# Patient Record
Sex: Male | Born: 1995 | Race: White | Hispanic: No | Marital: Single | Smoking: Former smoker
Health system: Southern US, Community
[De-identification: ages and names within clinical notes are randomized; demographics above are authoritative.]

## PROBLEM LIST (undated history)

## (undated) DIAGNOSIS — K589 Irritable bowel syndrome without diarrhea: Secondary | ICD-10-CM

## (undated) DIAGNOSIS — F909 Attention-deficit hyperactivity disorder, unspecified type: Secondary | ICD-10-CM

## (undated) DIAGNOSIS — F988 Other specified behavioral and emotional disorders with onset usually occurring in childhood and adolescence: Secondary | ICD-10-CM

## (undated) HISTORY — DX: Irritable bowel syndrome, unspecified: K58.9

## (undated) HISTORY — DX: Attention-deficit hyperactivity disorder, unspecified type: F90.9

## (undated) HISTORY — DX: Other specified behavioral and emotional disorders with onset usually occurring in childhood and adolescence: F98.8

---
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
## 2013-02-19 ENCOUNTER — Emergency Department: Payer: Self-pay | Admitting: Emergency Medicine

## 2013-02-19 LAB — COMPREHENSIVE METABOLIC PANEL
Albumin: 4.3 g/dL (ref 3.8–5.6)
Alkaline Phosphatase: 101 U/L (ref 98–317)
Anion Gap: 3 — ABNORMAL LOW (ref 7–16)
BUN: 14 mg/dL (ref 7–18)
Calcium, Total: 9.3 mg/dL (ref 9.0–10.7)
Co2: 34 mmol/L — ABNORMAL HIGH (ref 21–32)
Creatinine: 1.11 mg/dL (ref 0.60–1.30)
Glucose: 88 mg/dL (ref 65–99)
Potassium: 4 mmol/L (ref 3.5–5.1)
SGOT(AST): 32 U/L (ref 10–41)
Total Protein: 7.3 g/dL (ref 6.4–8.6)

## 2013-02-19 LAB — CBC
HGB: 16.1 g/dL (ref 13.0–18.0)
MCHC: 34.5 g/dL (ref 32.0–36.0)
MCV: 88 fL (ref 80–100)
RDW: 12.8 % (ref 11.5–14.5)
WBC: 10.8 10*3/uL — ABNORMAL HIGH (ref 3.8–10.6)

## 2013-02-20 LAB — URINALYSIS, COMPLETE
Bacteria: NONE SEEN
Bilirubin,UR: NEGATIVE
Glucose,UR: NEGATIVE mg/dL (ref 0–75)
Ketone: NEGATIVE
Leukocyte Esterase: NEGATIVE
Protein: NEGATIVE
RBC,UR: NONE SEEN /HPF (ref 0–5)
WBC UR: <1 /HPF (ref 0–5)

## 2013-05-02 ENCOUNTER — Emergency Department: Payer: Self-pay | Admitting: Emergency Medicine

## 2013-05-02 LAB — COMPREHENSIVE METABOLIC PANEL
Bilirubin,Total: 0.3 mg/dL (ref 0.2–1.0)
EGFR (African American): >60
EGFR (Non-African Amer.): >60
Osmolality: 276 (ref 275–301)
Potassium: 3.6 mmol/L (ref 3.5–5.1)
SGOT(AST): 38 U/L (ref 10–41)
Sodium: 139 mmol/L (ref 136–145)
Total Protein: 7 g/dL (ref 6.4–8.6)

## 2013-05-02 LAB — CBC
MCH: 30.9 pg (ref 26.0–34.0)
MCHC: 35.6 g/dL (ref 32.0–36.0)
Platelet: 228 10*3/uL (ref 150–440)
RBC: 5.2 10*6/uL (ref 4.40–5.90)
RDW: 12.9 % (ref 11.5–14.5)
WBC: 11.6 10*3/uL — ABNORMAL HIGH (ref 3.8–10.6)

## 2013-05-03 LAB — URINALYSIS, COMPLETE
Bacteria: NONE SEEN
Bilirubin,UR: NEGATIVE
Blood: NEGATIVE
Glucose,UR: NEGATIVE mg/dL (ref 0–75)
Leukocyte Esterase: NEGATIVE
Nitrite: NEGATIVE
RBC,UR: NONE SEEN /HPF (ref 0–5)
Specific Gravity: 1.005 (ref 1.003–1.030)
Squamous Epithelial: NONE SEEN
WBC UR: NONE SEEN /HPF (ref 0–5)

## 2013-05-05 ENCOUNTER — Emergency Department: Payer: Self-pay | Admitting: Emergency Medicine

## 2013-05-06 LAB — CBC
HGB: 17 g/dL (ref 13.0–18.0)
MCH: 30.3 pg (ref 26.0–34.0)
Platelet: 227 10*3/uL (ref 150–440)
RBC: 5.61 10*6/uL (ref 4.40–5.90)
RDW: 13.1 % (ref 11.5–14.5)

## 2013-05-06 LAB — COMPREHENSIVE METABOLIC PANEL
Albumin: 4.5 g/dL (ref 3.8–5.6)
Alkaline Phosphatase: 115 U/L (ref 98–317)
Anion Gap: 2 — ABNORMAL LOW (ref 7–16)
BUN: 11 mg/dL (ref 7–18)
Bilirubin,Total: 0.4 mg/dL (ref 0.2–1.0)
Calcium, Total: 9.3 mg/dL (ref 9.0–10.7)
Chloride: 106 mmol/L (ref 98–107)
Co2: 32 mmol/L (ref 21–32)
Creatinine: 1.02 mg/dL (ref 0.60–1.30)
EGFR (African American): >60
Glucose: 94 mg/dL (ref 65–99)
SGOT(AST): 36 U/L (ref 10–41)
SGPT (ALT): 31 U/L (ref 12–78)
Sodium: 140 mmol/L (ref 136–145)

## 2013-05-06 LAB — URINALYSIS, COMPLETE
Bacteria: NONE SEEN
Blood: NEGATIVE
Ketone: NEGATIVE
Leukocyte Esterase: NEGATIVE
Protein: 30
Specific Gravity: 1.025 (ref 1.003–1.030)
Squamous Epithelial: NONE SEEN

## 2013-10-05 IMAGING — CT CT ABD-PELV W/ CM
1 of 2 series · 15 of 32 positions shown, 19 images · non-contrast
Comparison: none

REASON FOR EXAM: (1) RLQ PAIN X 3-4 DAYS; (2) NAUSEA;    NOTE: Nursing to
Give Oral CT Contrast
COMMENTS:   May transport without cardiac monitor

[Series 2: 3mm soft tissue · axial · 0.61mm/px · z∈[-1022,-610]mm · 15 of 151 slices shown, 19 images]
[im 7/151  soft-tissue]
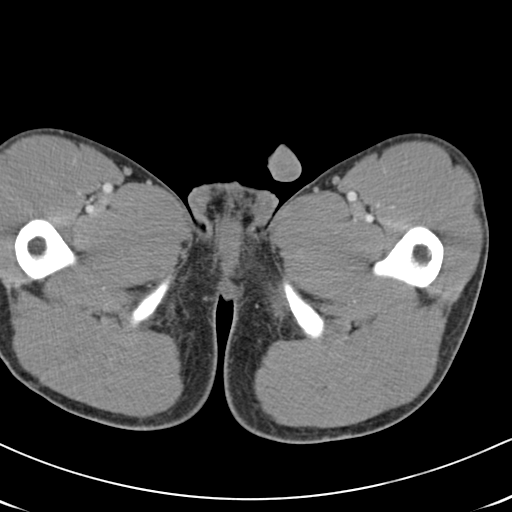
[im 7/151  bone]
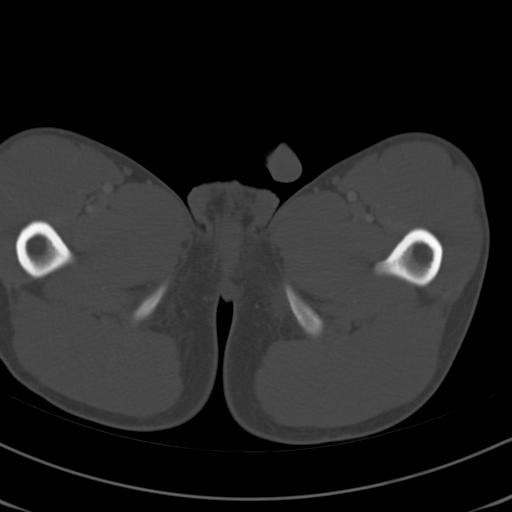
[im 19/151  soft-tissue]
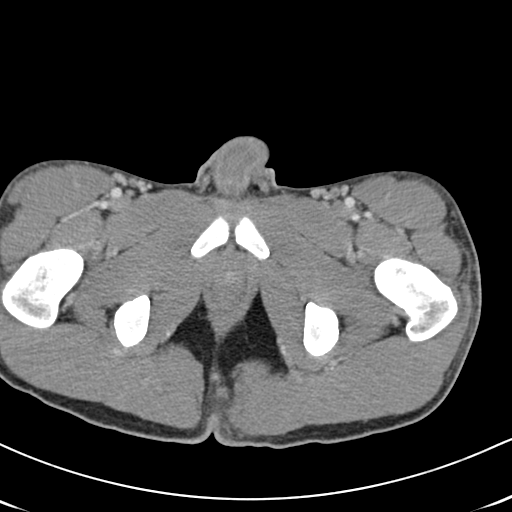
[im 32/151  soft-tissue]
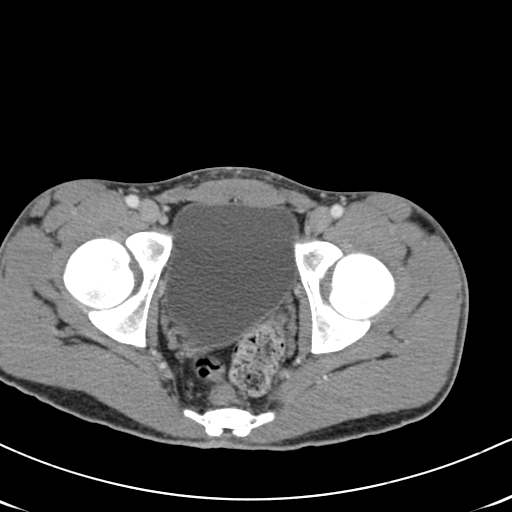
[im 44/151  soft-tissue]
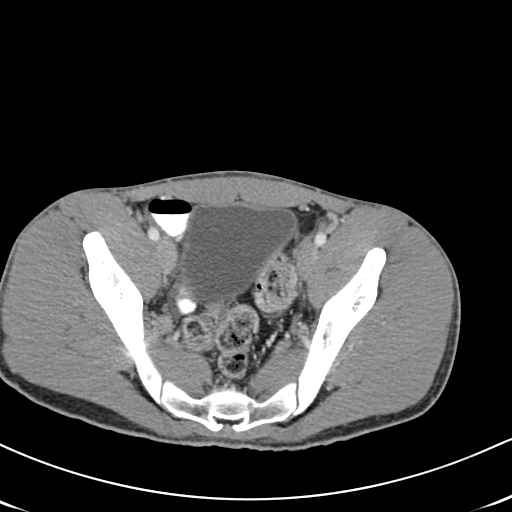
[im 51/151  soft-tissue]
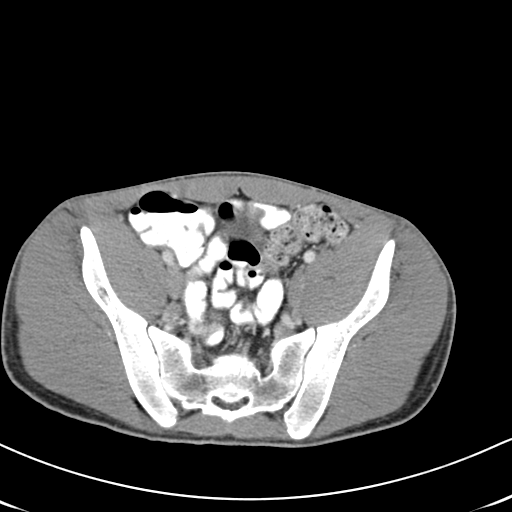
[im 63/151  soft-tissue]
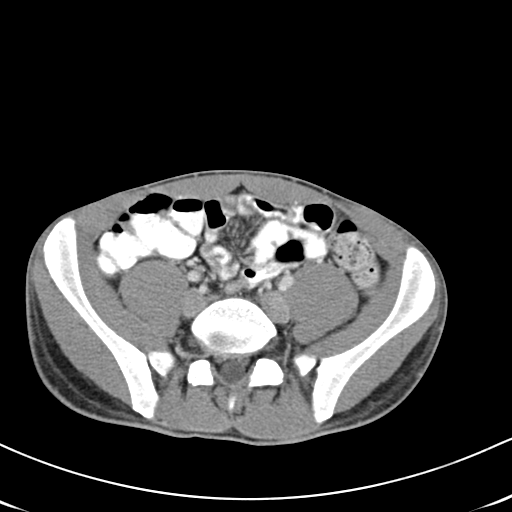
[im 76/151  soft-tissue]
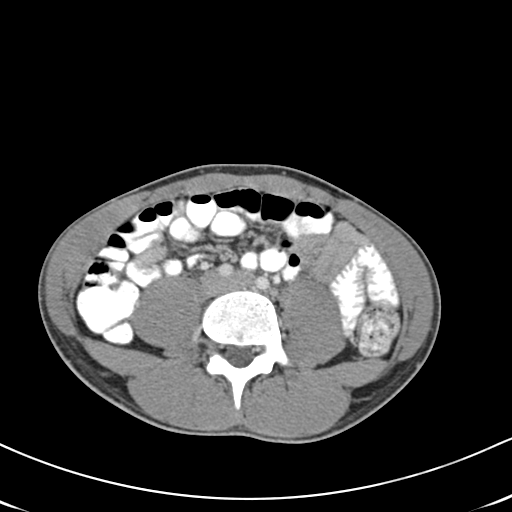
[im 88/151  soft-tissue]
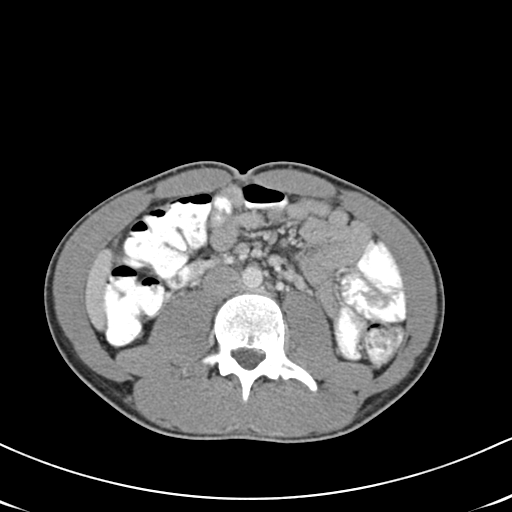
[im 101/151  soft-tissue]
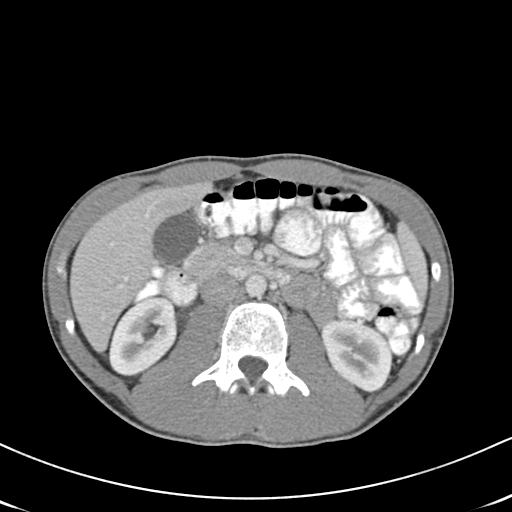
[im 101/151  bone]
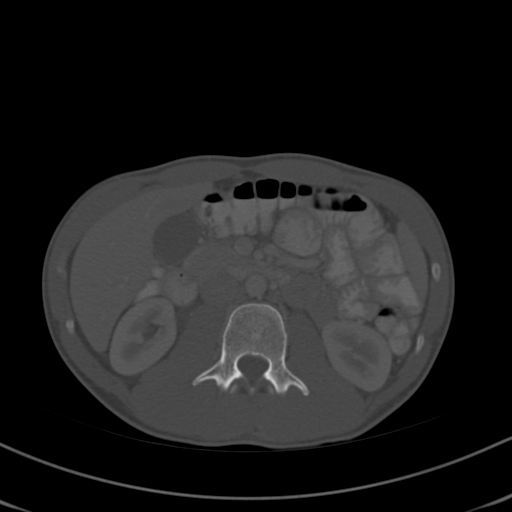
[im 107/151  soft-tissue]
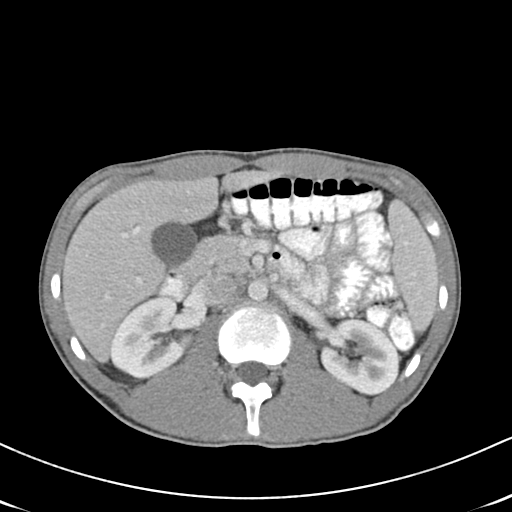
[im 119/151  soft-tissue]
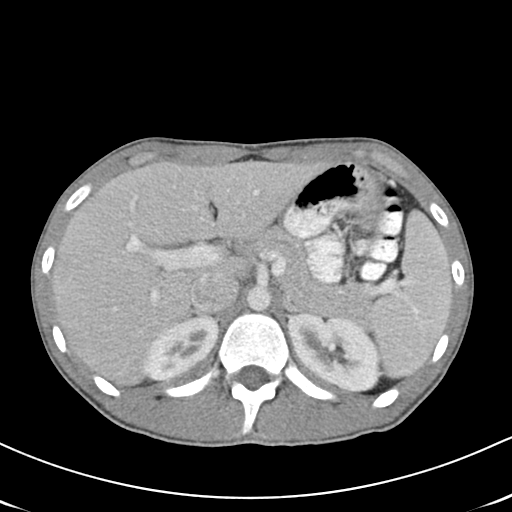
[im 126/151  lung]
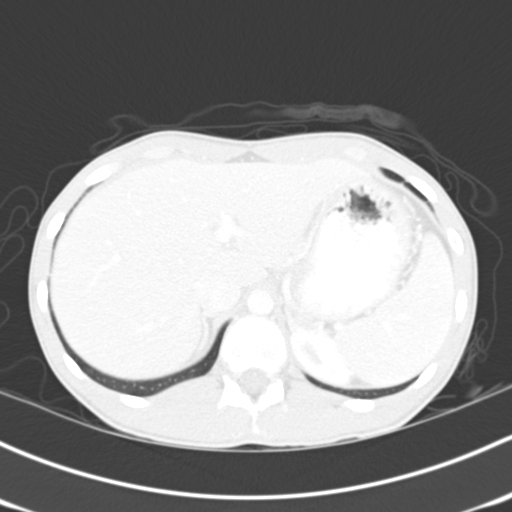
[im 132/151  soft-tissue]
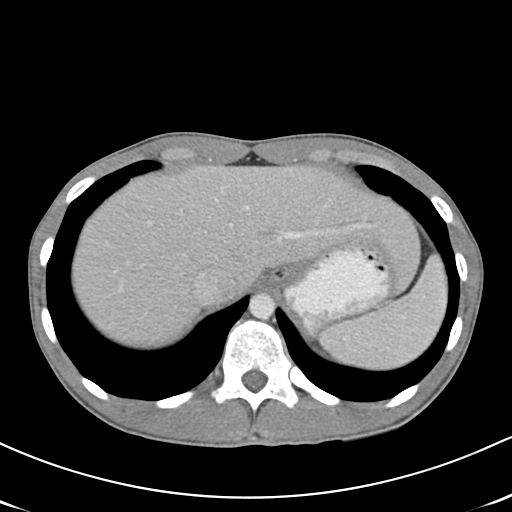
[im 132/151  lung]
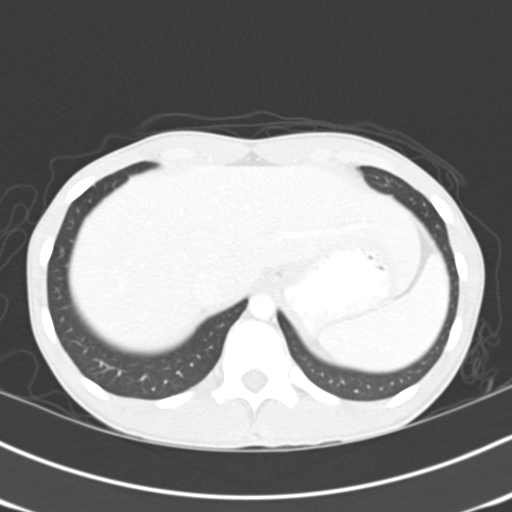
[im 138/151  lung]
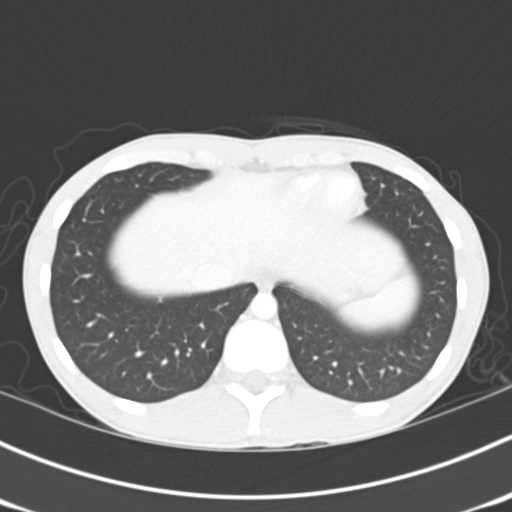
[im 144/151  soft-tissue]
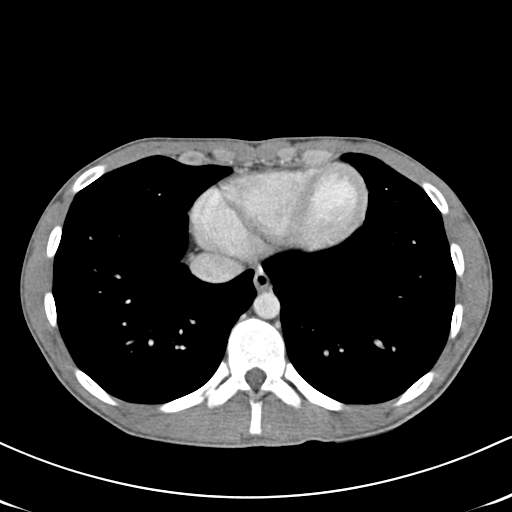
[im 144/151  lung]
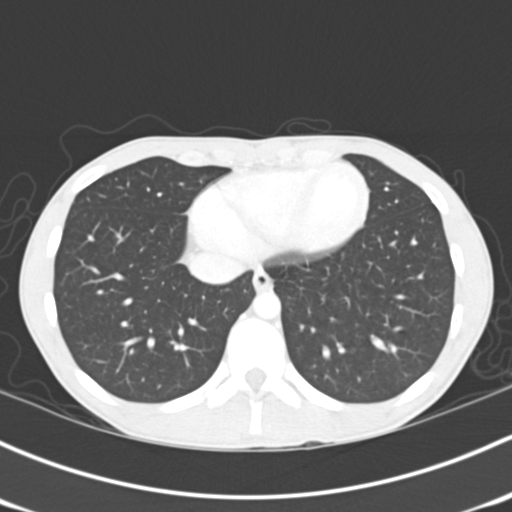

[15 of 32 positions shown; findings below may reference images not displayed]

PROCEDURE:     CT  - CT ABDOMEN / PELVIS  W  - May 03, 2013  [DATE]

RESULT:     CT of the abdomen and pelvis is performed with oral contrast and
100 mL of Dsovue-1XX iodinated contrast. Images are reconstructed in the
axial plane at 3.0 mm slice thickness and in the coronal plane at 3.0 mm
slice thickness. The patient has no previous exam for comparison.

The lung bases are clear. But the study shows no evidence of acute
appendicitis. No bowel wall thickening is evident. There no evidence of
radiopaque gallstones or of nephrolithiasis. The liver, spleen, pancreas,
adrenal glands, abdominal aorta, stomach, small bowel and colon appear to be
unremarkable. No adenopathy is evident. There is no ascites or abnormal
fluid collection.
IMPRESSION: 1. No acute abnormality in the abdomen or pelvis. No findings of acute
appendicitis evident.

[REDACTED]

## 2015-02-20 ENCOUNTER — Ambulatory Visit (INDEPENDENT_AMBULATORY_CARE_PROVIDER_SITE_OTHER): Payer: Managed Care, Other (non HMO) | Admitting: Primary Care

## 2015-02-20 ENCOUNTER — Encounter: Payer: Self-pay | Admitting: Primary Care

## 2015-02-20 VITALS — BP 118/64 | HR 72 | Temp 97.5°F | Ht 66.25 in | Wt 116.8 lb

## 2015-02-20 DIAGNOSIS — F419 Anxiety disorder, unspecified: Principal | ICD-10-CM

## 2015-02-20 DIAGNOSIS — H60393 Other infective otitis externa, bilateral: Secondary | ICD-10-CM | POA: Diagnosis not present

## 2015-02-20 DIAGNOSIS — F418 Other specified anxiety disorders: Secondary | ICD-10-CM | POA: Diagnosis not present

## 2015-02-20 DIAGNOSIS — F32A Depression, unspecified: Secondary | ICD-10-CM | POA: Insufficient documentation

## 2015-02-20 DIAGNOSIS — H65493 Other chronic nonsuppurative otitis media, bilateral: Secondary | ICD-10-CM | POA: Diagnosis not present

## 2015-02-20 DIAGNOSIS — H6693 Otitis media, unspecified, bilateral: Secondary | ICD-10-CM

## 2015-02-20 DIAGNOSIS — F329 Major depressive disorder, single episode, unspecified: Secondary | ICD-10-CM | POA: Insufficient documentation

## 2015-02-20 DIAGNOSIS — H60399 Other infective otitis externa, unspecified ear: Secondary | ICD-10-CM | POA: Insufficient documentation

## 2015-02-20 MED ORDER — PAROXETINE HCL 20 MG PO TABS: 20.0000 mg | ORAL_TABLET | Freq: Every day | ORAL | Status: DC

## 2015-02-20 MED ORDER — AMOXICILLIN-POT CLAVULANATE 875-125 MG PO TABS: 1.0000 | ORAL_TABLET | Freq: Two times a day (BID) | ORAL | Status: DC

## 2015-02-20 NOTE — Progress Notes (Signed)
Pre visit review using our clinic review tool, if applicable. No additional management support is needed unless otherwise documented below in the visit note. 

## 2015-02-20 NOTE — Assessment & Plan Note (Signed)
History of as a child with anger outbursts. PHQ-9 score of 21 today. Denies SI/HI. Declines therapy. Started paxil 20 mg tablets daily. Discussed side effects. Follow up in 4 weeks for re-evaluation.
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
## 2015-02-20 NOTE — Assessment & Plan Note (Signed)
Moderate amount of puss to bilateral ears with erythema to canals. He had no complaints of his ears today, so this was just noted on exam. He reports recurrent ear infections since childhood and now has decreased hearing bilaterally. Urgent referral made to ENT for patient. RX for Augmentin BID x 7 days.

## 2015-02-20 NOTE — Patient Instructions (Signed)
Start Augmentin antibiotics. Take 1 tablet by mouth twice daily for 7 days.  Start Paroxetine tablets for anxiety and depression. Take 1/2 tablet by mouth every morning for 6 days, then start taking 1 full tablet by mouth every morning on day 7.  Stop by the front and schedule your appointment to ENT with [facility].  Follow up in 4 weeks for re-evaluation.

## 2015-03-20 ENCOUNTER — Ambulatory Visit: Payer: Managed Care, Other (non HMO) | Admitting: Primary Care

## 2015-03-20 ENCOUNTER — Telehealth: Payer: Self-pay | Admitting: Primary Care

## 2015-03-20 DIAGNOSIS — Z0289 Encounter for other administrative examinations: Secondary | ICD-10-CM

## 2015-03-20 NOTE — Telephone Encounter (Signed)
Patient did not come in for their appointment today for 30 min 4 week follow up.  Please let me know if patient needs to be contacted immediately for follow up or no follow up needed.

## 2015-03-20 NOTE — Telephone Encounter (Signed)
Yes, please reschedule. Thanks.

## 2015-03-22 NOTE — Telephone Encounter (Signed)
Left message asking pt to return call.  

## 2015-03-27 NOTE — Telephone Encounter (Signed)
Scheduled 7/27

## 2015-03-28 ENCOUNTER — Ambulatory Visit: Payer: Managed Care, Other (non HMO) | Admitting: Primary Care

## 2015-07-29 ENCOUNTER — Encounter: Payer: Self-pay | Admitting: Emergency Medicine

## 2015-07-29 ENCOUNTER — Emergency Department
Admission: EM | Admit: 2015-07-29 | Discharge: 2015-07-29 | Disposition: A | Discharge status: Home / Self Care | Payer: Managed Care, Other (non HMO) | Attending: Emergency Medicine | Admitting: Emergency Medicine

## 2015-07-29 DIAGNOSIS — F909 Attention-deficit hyperactivity disorder, unspecified type: Secondary | ICD-10-CM | POA: Diagnosis not present

## 2015-07-29 DIAGNOSIS — F17210 Nicotine dependence, cigarettes, uncomplicated: Secondary | ICD-10-CM | POA: Diagnosis not present

## 2015-07-29 DIAGNOSIS — L259 Unspecified contact dermatitis, unspecified cause: Secondary | ICD-10-CM | POA: Insufficient documentation

## 2015-07-29 DIAGNOSIS — R609 Edema, unspecified: Secondary | ICD-10-CM | POA: Diagnosis present

## 2015-07-29 DIAGNOSIS — Z792 Long term (current) use of antibiotics: Secondary | ICD-10-CM | POA: Diagnosis not present

## 2015-07-29 DIAGNOSIS — Z79899 Other long term (current) drug therapy: Secondary | ICD-10-CM | POA: Diagnosis not present

## 2015-07-29 MED ORDER — HYDROXYZINE HCL 50 MG PO TABS
50.0000 mg | ORAL_TABLET | Freq: Once | ORAL | Status: AC
  Administered 2015-07-29: 50 mg via ORAL
  Filled 2015-07-29: qty 1

## 2015-07-29 MED ORDER — DEXAMETHASONE SODIUM PHOSPHATE 10 MG/ML IJ SOLN
10.0000 mg | Freq: Once | INTRAMUSCULAR | Status: AC
  Administered 2015-07-29: 10 mg via INTRAMUSCULAR
  Filled 2015-07-29: qty 1

## 2015-07-29 MED ORDER — METHYLPREDNISOLONE 4 MG PO TBPK: ORAL_TABLET | ORAL | Status: DC

## 2015-07-29 MED ORDER — HYDROXYZINE HCL 50 MG PO TABS: 50.0000 mg | ORAL_TABLET | Freq: Three times a day (TID) | ORAL | Status: DC | PRN

## 2015-07-29 NOTE — ED Notes (Signed)
Patient ambulatory to triage with steady gait, without difficulty or distress noted; pt reports since Friday having generalized rash/swelling to face with unknown cause; taking ibuprofen only for symptoms

## 2015-07-29 NOTE — Discharge Instructions (Signed)

## 2015-07-29 NOTE — ED Notes (Signed)
Pt has slight swelling to nose and upper lip. Denies itching, just has pain. Denies any new products in use. States has happened in past as well, but unsure why

## 2015-07-29 NOTE — ED Provider Notes (Signed)
[facility] Emergency Department Provider Note  ____________________________________________  Time seen: Approximately 10:34 PM  I have reviewed the triage vital signs and the nursing notes.   HISTORY  Chief Complaint Allergic Reaction    HPI Brent Shields is a 19 y.o. male facial erythema and mild edema. Patient state this is his third episode having this reaction. Patient states does not know what is causing the reaction. Patient states in the past the swelling and redness resolves in 3-5 days. Patient is concerned because this onset is worse than the previous ones. Patient denies any dysphagia or dyspnea. Patient denies any new foods or personal hygiene products  Past Medical History  Diagnosis Date  . ADD (attention deficit disorder)   . IBS (irritable bowel syndrome)     Patient Active Problem List   Diagnosis Date Noted  . Anxiety and depression 02/20/2015  . Otitis, externa, infective 02/20/2015    History reviewed. No pertinent past surgical history.  Current Outpatient Rx  Name  Route  Sig  Dispense  Refill  . amoxicillin-clavulanate (AUGMENTIN) 875-125 MG per tablet   Oral   Take 1 tablet by mouth 2 (two) times daily.   14 tablet   0   . hydrOXYzine (ATARAX/VISTARIL) 50 MG tablet   Oral   Take 1 tablet (50 mg total) by mouth 3 (three) times daily as needed.   30 tablet   0   . methylPREDNISolone (MEDROL DOSEPAK) 4 MG TBPK tablet      Take Tapered dose as directed   21 tablet   0   . PARoxetine (PAXIL) 20 MG tablet   Oral   Take 1 tablet (20 mg total) by mouth daily.   30 tablet   1     Allergies Review of patient's allergies indicates no known allergies.  No family history on file.  Social History Social History  Substance Use Topics  . Smoking status: Current Every Day Smoker -- 0.50 packs/day    Types: Cigarettes  . Smokeless tobacco: None  . Alcohol Use: No    Review of Systems Constitutional: No  fever/chills Eyes: No visual changes. ENT: No sore throat. Cardiovascular: Denies chest pain. Respiratory: Denies shortness of breath. Gastrointestinal: No abdominal pain.  No nausea, no vomiting.  No diarrhea.  No constipation. Genitourinary: Negative for dysuria. Musculoskeletal: Negative for back pain. Skin: Negative for rash. Neurological: Negative for headaches, focal weakness or numbness. *Psychiatric:ADD 10-point ROS otherwise negative.  ____________________________________________   PHYSICAL EXAM:  VITAL SIGNS: ED Triage Vitals  Enc Vitals Group     BP 07/29/15 2220 146/80 mmHg     Pulse Rate 07/29/15 2220 105     Resp 07/29/15 2220 20     Temp 07/29/15 2220 98 F (36.7 C)     Temp Source 07/29/15 2220 Oral     SpO2 07/29/15 2220 97 %     Weight 07/29/15 2220 112 lb (50.803 kg)     Height 07/29/15 2220 5\' 6"  (1.676 m)     Head Cir --      Peak Flow --      Pain Score --      Pain Loc --      Pain Edu? --      Excl. in GC? --     Constitutional: Alert and oriented. Well appearing and in no acute distress. Eyes: Conjunctivae are normal. PERRL. EOMI. Head: Atraumatic. Nose: No congestion/rhinnorhea. Mouth/Throat: Mucous membranes are moist.  Oropharynx non-erythematous. Neck: No  stridor. No cervical spine tenderness to palpation. Hematological/Lymphatic/Immunilogical: No cervical lymphadenopathy. Cardiovascular: Normal rate, regular rhythm. Grossly normal heart sounds.  Good peripheral circulation. Respiratory: Normal respiratory effort.  No retractions. Lungs CTAB. Gastrointestinal: Soft and nontender. No distention. No abdominal bruits. No CVA tenderness. Musculoskeletal: No lower extremity tenderness nor edema.  No joint effusions. Neurologic:  Normal speech and language. No gross focal neurologic deficits are appreciated. No gait instability. Skin:  Skin is warm, dry and intact. Erythema and edema confined to the nasal in no oral areas. Psychiatric: Mood  and affect are normal. Speech and behavior are normal.  ____________________________________________   LABS (all labs ordered are listed, but only abnormal results are displayed)  Labs Reviewed - No data to display ____________________________________________  EKG   ____________________________________________  RADIOLOGY   ____________________________________________   PROCEDURES  Procedure(s) performed: None  Critical Care performed: No  ____________________________________________   INITIAL IMPRESSION / ASSESSMENT AND PLAN / ED COURSE  Pertinent labs & imaging results that were available during my care of the patient were reviewed by me and considered in my medical decision making (see chart for details).  Contact dermatitis. Patient given Decadron Atarax in the ER. Patient given prescription for prednisone and Atarax upon discharge. Advised patient to try to discover the etiology for his condition. Return back to ER if condition worsens. ____________________________________________   FINAL CLINICAL IMPRESSION(S) / ED DIAGNOSES  Final diagnoses:  Contact dermatitis       Joni Reining, PA-C 07/29/15 2244  Sharman Cheek, MD 07/29/15 2330

## 2015-07-29 NOTE — ED Notes (Signed)
AAOx3.  Skin warm and dry.  NAD 

## 2016-05-23 ENCOUNTER — Ambulatory Visit (INDEPENDENT_AMBULATORY_CARE_PROVIDER_SITE_OTHER): Payer: Managed Care, Other (non HMO) | Admitting: Family Medicine

## 2016-05-23 ENCOUNTER — Encounter: Payer: Self-pay | Admitting: Family Medicine

## 2016-05-23 VITALS — BP 148/80 | HR 84 | Wt 120.0 lb

## 2016-05-23 DIAGNOSIS — H9213 Otorrhea, bilateral: Secondary | ICD-10-CM

## 2016-05-23 DIAGNOSIS — K089 Disorder of teeth and supporting structures, unspecified: Secondary | ICD-10-CM | POA: Diagnosis not present

## 2016-05-23 DIAGNOSIS — R220 Localized swelling, mass and lump, head: Secondary | ICD-10-CM | POA: Diagnosis not present

## 2016-05-23 MED ORDER — AMOXICILLIN-POT CLAVULANATE 250-62.5 MG/5ML PO SUSR: 500.0000 mg | Freq: Two times a day (BID) | ORAL | 0 refills | Status: AC

## 2016-05-23 NOTE — Progress Notes (Signed)
Subjective:    Patient ID: Brent Shields, male    DOB: 1995/10/24, 20 y.o.   MRN: [id number]  HPI This is a 20 yo male, accompanied by his friend, who presents today with 3 days of facial swelling and pain. Has a history of bad teeth and has had similar symptoms several months ago and was seen in ED and treated with antibiotics. No sore throat. No pain in mouth.  He has been trying to save money to go to the dentist.  Has chronic drainage of ears and was referred to ENT last year. Did not keep his appointment, due to a combination of expense and it was rescheduled to a later date. He has been told he needs to have something done with his ears or he is at risk from hearing loss.  Tobacco use 1/2 ppd. 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 Enjoys restaurant work, just not where he is currently working, doesn't like his schedule and not having a day off.   Past Medical History:  Diagnosis Date  . ADD (attention deficit disorder)   . IBS (irritable bowel syndrome)    No past surgical history on file. No family history on file. Social History  Substance Use Topics  . Smoking status: Current Every Day Smoker    Packs/day: 0.50    Types: Cigarettes  . Smokeless tobacco: Not on file  . Alcohol use No      Review of Systems Per HIP    Objective:   Physical Exam  Constitutional: He is oriented to person, place, and time. He appears well-developed and well-nourished.  HENT:  Head: Atraumatic.  Right Ear: There is drainage.  Left Ear: There is drainage.  Copious amount yellow drainage in bilateral ear canals which are erythematous. Tender to exam. Unable to visualize TMs.  Swelling along nasal folds and lower cheeks R>L. No erythema. No swelling of lips or eyelids.  Front teeth, upper and lower, decayed and broken. Gums are without obvious infection, non tender.   Eyes: Conjunctivae are normal.  Cardiovascular: Normal rate.     Pulmonary/Chest: Effort normal.  Musculoskeletal: Normal range of motion.  Lymphadenopathy:    He has no cervical adenopathy.  Neurological: He is alert and oriented to person, place, and time.  Skin: Skin is warm and dry.  Psychiatric: He has a normal mood and affect. His behavior is normal. Judgment and thought content normal.  Vitals reviewed.     BP (!) 148/80   Pulse 84   Wt 120 lb (54.4 kg)   SpO2 97%   BMI 19.37 kg/m  Wt Readings from Last 3 Encounters:  05/23/16 120 lb (54.4 kg)  07/29/15 112 lb (50.8 kg)  02/20/15 116 lb 12.8 oz (53 kg)       Assessment & Plan:  1. Facial swelling - will cover for cellulitis, does not appear to be an allergic reaction/contact dermatitis - amoxicillin-clavulanate (AUGMENTIN) 250-62.5 MG/5ML suspension; Take 10 mLs (500 mg total) by mouth 2 (two) times daily.  Dispense: 150 mL; Refill: 0 - RTC precautions reviewed  2. Poor dentition - Ambulatory referral to Dentistry  3. Ear drainage, bilateral - physical exam findings similar to provider's notes from 02/20/15 - amoxicillin-clavulanate (AUGMENTIN) 250-62.5 MG/5ML suspension; Take 10 mLs (500 mg total) by mouth 2 (two) times daily.  Dispense: 150 mL; Refill: 0 - Ambulatory referral to ENT  - Had discussion with patient  regarding need to be proactive with his chronic health issues- teeth, ears. Encouraged him to consider smoking cessation.   Olean Ree, FNP-BC  [facility], [facility]  05/23/2016 3:19 PM

## 2016-05-23 NOTE — Patient Instructions (Signed)
I have sent a liquid antibiotic to your pharmacy- take 2 teaspoons twice a day for 7 days If your swelling gets worse or if you develop any difficulty swallowing or breathing call 911 Shirlee LimerickMarion will call you next week about seeing a dentist

## 2017-05-10 ENCOUNTER — Emergency Department
Admission: EM | Admit: 2017-05-10 | Discharge: 2017-05-10 | Disposition: A | Discharge status: Home / Self Care | Payer: 59 | Attending: Emergency Medicine | Admitting: Emergency Medicine

## 2017-05-10 DIAGNOSIS — F17210 Nicotine dependence, cigarettes, uncomplicated: Secondary | ICD-10-CM | POA: Diagnosis not present

## 2017-05-10 DIAGNOSIS — H60331 Swimmer's ear, right ear: Secondary | ICD-10-CM | POA: Diagnosis not present

## 2017-05-10 DIAGNOSIS — H9201 Otalgia, right ear: Secondary | ICD-10-CM | POA: Diagnosis present

## 2017-05-10 MED ORDER — NEOMYCIN-POLYMYXIN-HC 3.5-10000-1 OT SOLN: 3.0000 [drp] | Freq: Three times a day (TID) | OTIC | 0 refills | Status: AC

## 2017-05-10 NOTE — ED Triage Notes (Signed)
Pt c/o right ear pain and some loss of hearing since Thursday, states he has a hx of ear infection in the past.

## 2017-05-10 NOTE — ED Provider Notes (Signed)
[facility] Emergency Department Provider Note  ____________________________________________  Time seen: Approximately 3:18 PM  I have reviewed the triage vital signs and the nursing notes.   HISTORY  Chief Complaint Otalgia    HPI Brent LundJohn E Shampine is a 21 y.o. male Who presents to emergency department complaining of right ear pain. Patient reports that symptoms have been ongoing times several days. Patient reports history of previous otitis externa infections to bilateral ears. Patient denies any fevers or chills, nasal congestion, sore throat. he does not use any eardrops in his ear and has not used a Q-tip.   Past Medical History:  Diagnosis Date  . ADD (attention deficit disorder)   . IBS (irritable bowel syndrome)     Patient Active Problem List   Diagnosis Date Noted  . Anxiety and depression 02/20/2015  . Otitis, externa, infective 02/20/2015    History reviewed. No pertinent surgical history.  Prior to Admission medications   Medication Sig Start Date End Date Taking? Authorizing Provider  neomycin-polymyxin-hydrocortisone (CORTISPORIN) OTIC solution Place 3 drops into the right ear 3 (three) times daily. 05/10/17 05/20/17  Cuthriell, Delorise RoyalsJonathan D, PA-C    Allergies Patient has no known allergies.  No family history on file.  Social History Social History  Substance Use Topics  . Smoking status: Current Every Day Smoker    Packs/day: 0.50    Types: Cigarettes  . Smokeless tobacco: Never Used  . Alcohol use No     Review of Systems  Constitutional: No fever/chills Eyes: No visual changes. No discharge ENT: positive for right ear pain Cardiovascular: no chest pain. Respiratory: no cough. No SOB. Gastrointestinal: No abdominal pain.  No nausea, no vomiting.  No diarrhea.  No constipation. Musculoskeletal: Negative for musculoskeletal pain. Skin: Negative for rash, abrasions, lacerations, ecchymosis. Neurological: Negative for  headaches, focal weakness or numbness. 10-point ROS otherwise negative.  ____________________________________________   PHYSICAL EXAM:  VITAL SIGNS: ED Triage Vitals  Enc Vitals Group     BP 05/10/17 1350 122/82     Pulse Rate 05/10/17 1350 73     Resp 05/10/17 1350 18     Temp 05/10/17 1350 97.6 F (36.4 C)     Temp Source 05/10/17 1350 Oral     SpO2 05/10/17 1350 100 %     Weight 05/10/17 1350 120 lb (54.4 kg)     Height 05/10/17 1350 5\' 6"  (1.676 m)     Head Circumference --      Peak Flow --      Pain Score 05/10/17 1359 8     Pain Loc --      Pain Edu? --      Excl. in GC? --      Constitutional: Alert and oriented. Well appearing and in no acute distress. Eyes: Conjunctivae are normal. PERRL. EOMI. Head: Atraumatic. ENT:      Ears: EC and TM on left is unremarkable. EAC on right is grossly edematous and erythematous. Tenderness to palpation over the tragus.TM is visualized with no bulging or air mucoid fluid level.      Nose: No congestion/rhinnorhea.      Mouth/Throat: Mucous membranes are moist.  Neck: No stridor.   Hematological/Lymphatic/Immunilogical: No cervical lymphadenopathy. Cardiovascular: Normal rate, regular rhythm. Normal S1 and S2.  Good peripheral circulation. Respiratory: Normal respiratory effort without tachypnea or retractions. Lungs CTAB. Good air entry to the bases with no decreased or absent breath sounds. Musculoskeletal: Full range of motion to all extremities. No gross deformities  appreciated. Neurologic:  Normal speech and language. No gross focal neurologic deficits are appreciated.  Skin:  Skin is warm, dry and intact. No rash noted. Psychiatric: Mood and affect are normal. Speech and behavior are normal. Patient exhibits appropriate insight and judgement.   ____________________________________________   LABS (all labs ordered are listed, but only abnormal results are displayed)  Labs Reviewed - No data to  display ____________________________________________  EKG   ____________________________________________  RADIOLOGY   No results found.  ____________________________________________    PROCEDURES  Procedure(s) performed:    Procedures    Medications - No data to display   ____________________________________________   INITIAL IMPRESSION / ASSESSMENT AND PLAN / ED COURSE  Pertinent labs & imaging results that were available during my care of the patient were reviewed by me and considered in my medical decision making (see chart for details).  Review of the [place] CSRS was performed in accordance of the [facility] prior to dispensing any controlled drugs.     Patient's diagnosis is consistent with otitis externa. Patient will be discharged home with prescriptions for antibiotic ear drops. Patient is to follow up with primary care as needed or otherwise directed. Patient is given ED precautions to return to the ED for any worsening or new symptoms.     ____________________________________________  FINAL CLINICAL IMPRESSION(S) / ED DIAGNOSES  Final diagnoses:  Acute swimmer's ear of right side      NEW MEDICATIONS STARTED DURING THIS VISIT:  New Prescriptions   NEOMYCIN-POLYMYXIN-HYDROCORTISONE (CORTISPORIN) OTIC SOLUTION    Place 3 drops into the right ear 3 (three) times daily.        This chart was dictated using voice recognition software/Dragon. Despite best efforts to proofread, errors can occur which can change the meaning. Any change was purely unintentional.    Racheal Patches, PA-C 05/10/17 1530    Jeanmarie Plant, MD 05/10/17 2222

## 2017-05-10 NOTE — ED Notes (Signed)
See triage note, pt c/o right ear pain with decreased hearing.

## 2018-05-13 ENCOUNTER — Telehealth: Payer: Self-pay

## 2018-05-13 NOTE — Telephone Encounter (Signed)
Patient advised as below on voicemail-Jendaya Gossett V Azreal Stthomas, RMA

## 2018-05-13 NOTE — Telephone Encounter (Signed)
Copied from CRM [phone]#[id number]. Topic: General - Other >> May 12, 2018 12:17 PM Angela NevinWilliams, Candice N wrote: Reason for CRM: [facility] Please advise.

## 2018-05-13 NOTE — Telephone Encounter (Signed)
Approved. Thanks.

## 2018-07-09 ENCOUNTER — Ambulatory Visit: Payer: 59 | Admitting: Primary Care

## 2018-07-09 DIAGNOSIS — Z0289 Encounter for other administrative examinations: Secondary | ICD-10-CM

## 2018-08-06 ENCOUNTER — Encounter (HOSPITAL_BASED_OUTPATIENT_CLINIC_OR_DEPARTMENT_OTHER): Payer: Self-pay

## 2018-08-06 ENCOUNTER — Emergency Department (HOSPITAL_BASED_OUTPATIENT_CLINIC_OR_DEPARTMENT_OTHER): Payer: 59

## 2018-08-06 ENCOUNTER — Emergency Department (HOSPITAL_BASED_OUTPATIENT_CLINIC_OR_DEPARTMENT_OTHER)
Admission: EM | Admit: 2018-08-06 | Discharge: 2018-08-07 | Disposition: A | Discharge status: Home / Self Care | Payer: 59 | Attending: Emergency Medicine | Admitting: Emergency Medicine

## 2018-08-06 ENCOUNTER — Other Ambulatory Visit: Payer: Self-pay

## 2018-08-06 DIAGNOSIS — J069 Acute upper respiratory infection, unspecified: Secondary | ICD-10-CM | POA: Insufficient documentation

## 2018-08-06 DIAGNOSIS — B9789 Other viral agents as the cause of diseases classified elsewhere: Secondary | ICD-10-CM

## 2018-08-06 DIAGNOSIS — Z87891 Personal history of nicotine dependence: Secondary | ICD-10-CM | POA: Diagnosis not present

## 2018-08-06 DIAGNOSIS — M791 Myalgia, unspecified site: Secondary | ICD-10-CM | POA: Diagnosis present

## 2018-08-06 MED ORDER — IBUPROFEN 600 MG PO TABS: 600.0000 mg | ORAL_TABLET | Freq: Four times a day (QID) | ORAL | 0 refills | Status: DC | PRN

## 2018-08-06 MED ORDER — ACETAMINOPHEN 325 MG PO TABS
650.0000 mg | ORAL_TABLET | Freq: Once | ORAL | Status: AC
  Administered 2018-08-06: 650 mg via ORAL
  Filled 2018-08-06: qty 2

## 2018-08-06 MED ORDER — BENZONATATE 100 MG PO CAPS: 100.0000 mg | ORAL_CAPSULE | Freq: Three times a day (TID) | ORAL | 0 refills | Status: DC

## 2018-08-06 NOTE — ED Provider Notes (Signed)
[facility] EMERGENCY DEPARTMENT Provider Note   CSN: [id number] Arrival date & time: 08/06/18  1945     History   Chief Complaint Chief Complaint  Patient presents with  . Cough    HPI Brent Shields is a 22 y.o. male.  HPI  This is a 22 year old male who presents with myalgias and cough.  Patient reports several days of myalgias, cold sweats, nonproductive cough.  He did not take his temperature at home but temperature here was noted to be 103.2.  Denies runny nose, ear pain, sore throat.  Denies nausea, vomiting, diarrhea, abdominal pain.  He is a non-smoker.  He states "I gave it a few days but I kept sweating in my bed."  He did take Tylenol with some relief of symptoms.  He did not receive a flu shot this year.  Past Medical History:  Diagnosis Date  . ADD (attention deficit disorder)   . IBS (irritable bowel syndrome)     Patient Active Problem List   Diagnosis Date Noted  . Anxiety and depression 02/20/2015  . Otitis, externa, infective 02/20/2015    History reviewed. No pertinent surgical history.      Home Medications    Prior to Admission medications   Medication Sig Start Date End Date Taking? Authorizing Provider  benzonatate (TESSALON) 100 MG capsule Take 1 capsule (100 mg total) by mouth every 8 (eight) hours. 08/06/18   Horton, Mayer Maskerourtney F, MD  ibuprofen (ADVIL,MOTRIN) 600 MG tablet Take 1 tablet (600 mg total) by mouth every 6 (six) hours as needed. 08/06/18   Horton, Mayer Maskerourtney F, MD    Family History No family history on file.  Social History Social History   Tobacco Use  . Smoking status: Former Smoker    Packs/day: 0.50    Types: Cigarettes  . Smokeless tobacco: Never Used  Substance Use Topics  . Alcohol use: No    Alcohol/week: 0.0 standard drinks  . Drug use: Never     Allergies   Patient has no known allergies.   Review of Systems Review of Systems  Constitutional: Positive for chills and fever.  HENT: Negative for  congestion.   Respiratory: Positive for cough. Negative for shortness of breath.   Cardiovascular: Negative for chest pain.  Gastrointestinal: Negative for abdominal pain, diarrhea, nausea and vomiting.  Musculoskeletal: Positive for myalgias.  Neurological: Negative for headaches.  All other systems reviewed and are negative.    Physical Exam Updated Vital Signs BP 128/81 (BP Location: Left Arm)   Pulse 99   Temp (!) 100.8 F (38.2 C) (Oral)   Resp 18   Wt 59.7 kg   SpO2 100%   BMI 21.26 kg/m   Physical Exam  Constitutional: He is oriented to person, place, and time. He appears well-developed and well-nourished. No distress.  HENT:  Head: Normocephalic and atraumatic.  Mouth/Throat: Oropharynx is clear and moist.  Eyes: Pupils are equal, round, and reactive to light.  Neck: Normal range of motion. Neck supple.  Cardiovascular: Normal rate, regular rhythm and normal heart sounds.  No murmur heard. Pulmonary/Chest: Effort normal and breath sounds normal. No respiratory distress. He has no wheezes.  Abdominal: Soft. Bowel sounds are normal. There is no tenderness. There is no rebound.  Musculoskeletal: He exhibits no edema.  Neurological: He is alert and oriented to person, place, and time.  Skin: Skin is warm and dry. No rash noted.  Psychiatric: He has a normal mood and affect.  Nursing note and  vitals reviewed.    ED Treatments / Results  Labs (all labs ordered are listed, but only abnormal results are displayed) Labs Reviewed - No data to display  EKG None  Radiology Dg Chest 2 View  Result Date: 08/06/2018 CLINICAL DATA:  Cough, chest pain, fever EXAM: CHEST - 2 VIEW COMPARISON:  None. FINDINGS: Lungs are clear.  No pleural effusion or pneumothorax. Heart is normal in size. Visualized osseous structures are within normal limits. IMPRESSION: Normal chest radiographs. Electronically Signed   By: Charline Bills M.D.   On: 08/06/2018 21:55     Procedures Procedures (including critical care time)  Medications Ordered in ED Medications  acetaminophen (TYLENOL) tablet 650 mg (650 mg Oral Given 08/06/18 2125)     Initial Impression / Assessment and Plan / ED Course  I have reviewed the triage vital signs and the nursing notes.  Pertinent labs & imaging results that were available during my care of the patient were reviewed by me and considered in my medical decision making (see chart for details).     Patient presents with myalgias, chills, cough.  He is overall nontoxic-appearing.  Initial vital signs notable for temperature of 103.2.  Otherwise he does not appear ill or septic appearing.  Chest x-ray obtained to rule out pneumonia.  Chest x-ray is clear.  Breath sounds are clear as well.  We discussed the possibility this was flu.  He is otherwise healthy.  He deferred Tamiflu.  Recommended supportive measures.  Tylenol or ibuprofen for fevers.  Tessalon Perles for cough.  Patient stated understanding.  At this time do not feel he needs further work-up.  This is likely viral in nature and patient is very well-appearing.  After history, exam, and medical workup I feel the patient has been appropriately medically screened and is safe for discharge home. Pertinent diagnoses were discussed with the patient. Patient was given return precautions.   Final Clinical Impressions(s) / ED Diagnoses   Final diagnoses:  Viral URI with cough    ED Discharge Orders         Ordered    ibuprofen (ADVIL,MOTRIN) 600 MG tablet  Every 6 hours PRN     08/06/18 2348    benzonatate (TESSALON) 100 MG capsule  Every 8 hours     08/06/18 2348           Horton, Mayer Masker, MD 08/07/18 0101

## 2018-08-06 NOTE — Discharge Instructions (Addendum)
YOu were seen today for cough and fever.  This is likely related to the virus.  It could be the flu.  Supportive measures including Tylenol or Motrin for fevers.  You may take Tessalon Perles for cough.  If you develop any new or worsening symptoms you should be reevaluated.

## 2018-08-06 NOTE — ED Triage Notes (Addendum)
C/o flu like sx x 5 days-NAD-steady gait-took motrin 600mg  10min PTA

## 2020-02-26 IMAGING — CR DG CHEST 2V
2 series · 2 of 2 positions shown · non-contrast
Comparison: None.

CLINICAL DATA: Cough, chest pain, fever

EXAM:
CHEST - 2 VIEW

[w chest pa]
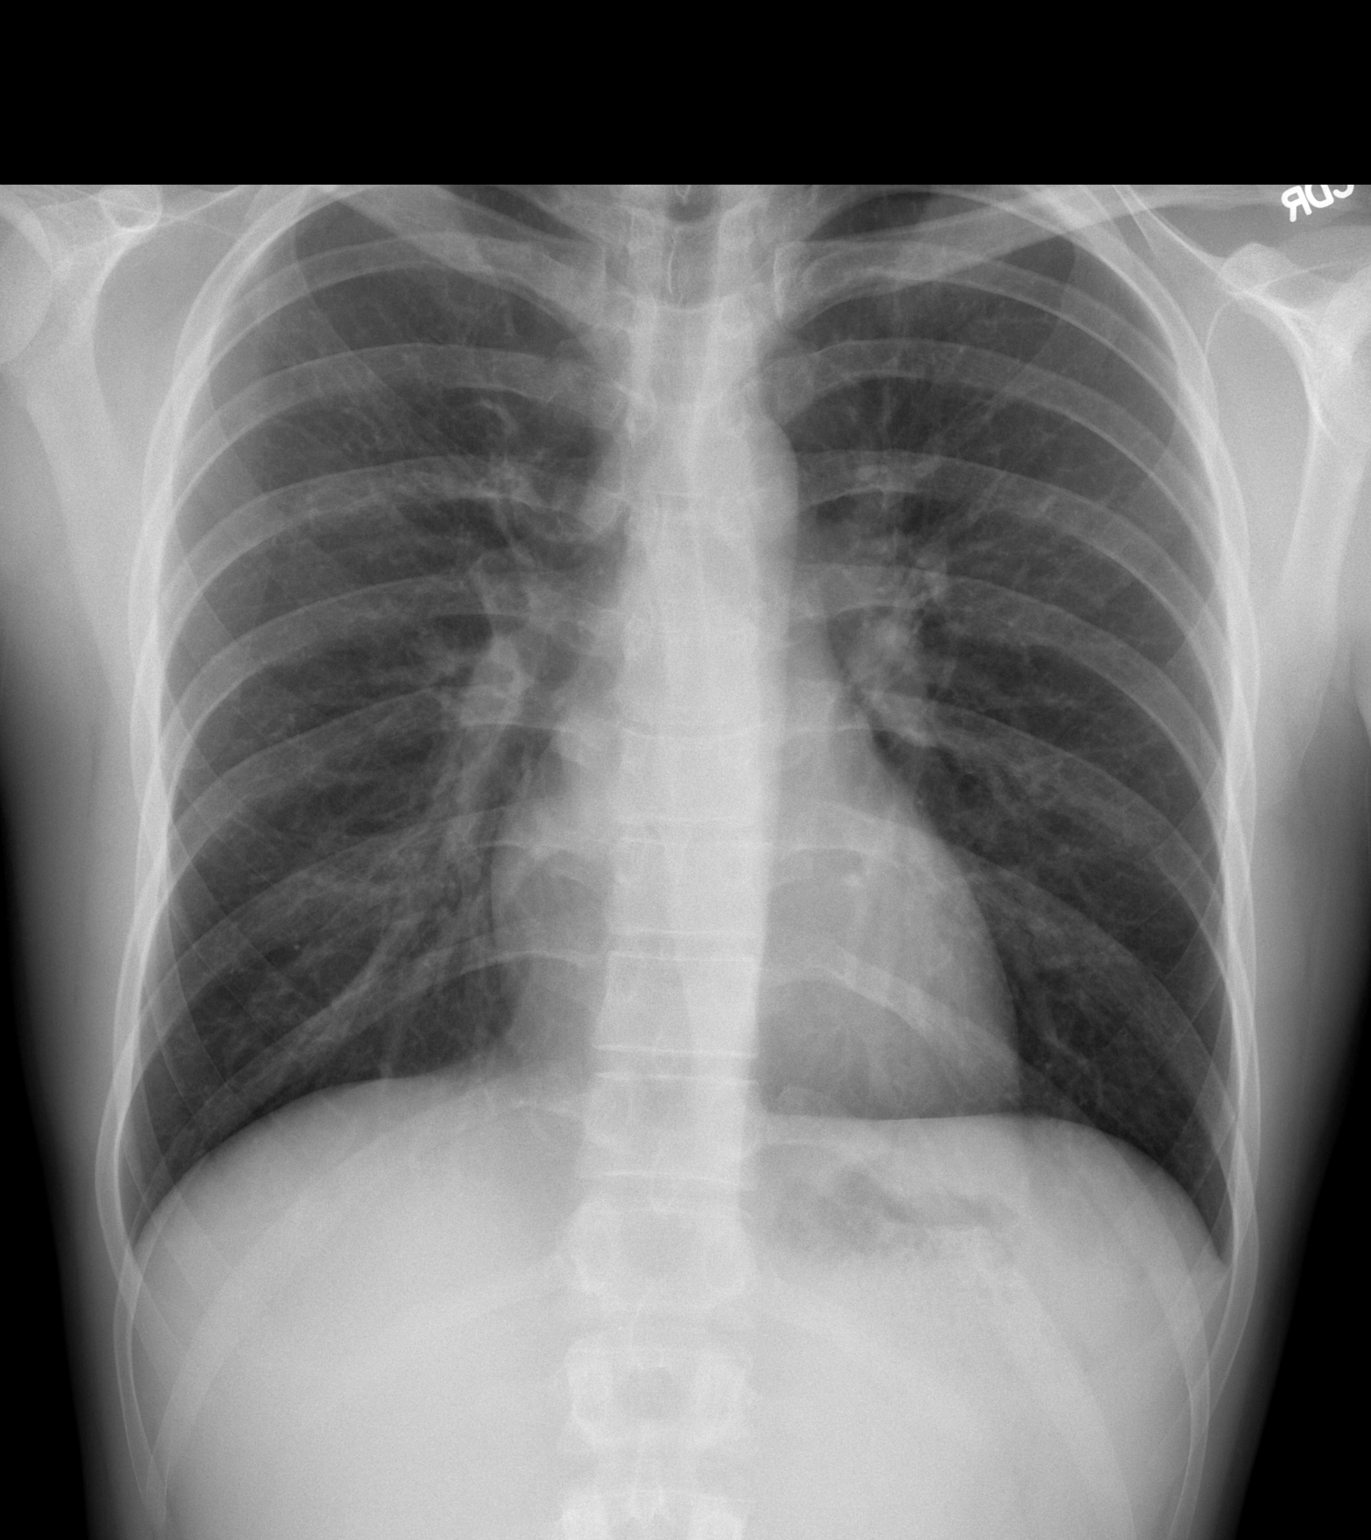

[w chest lat]
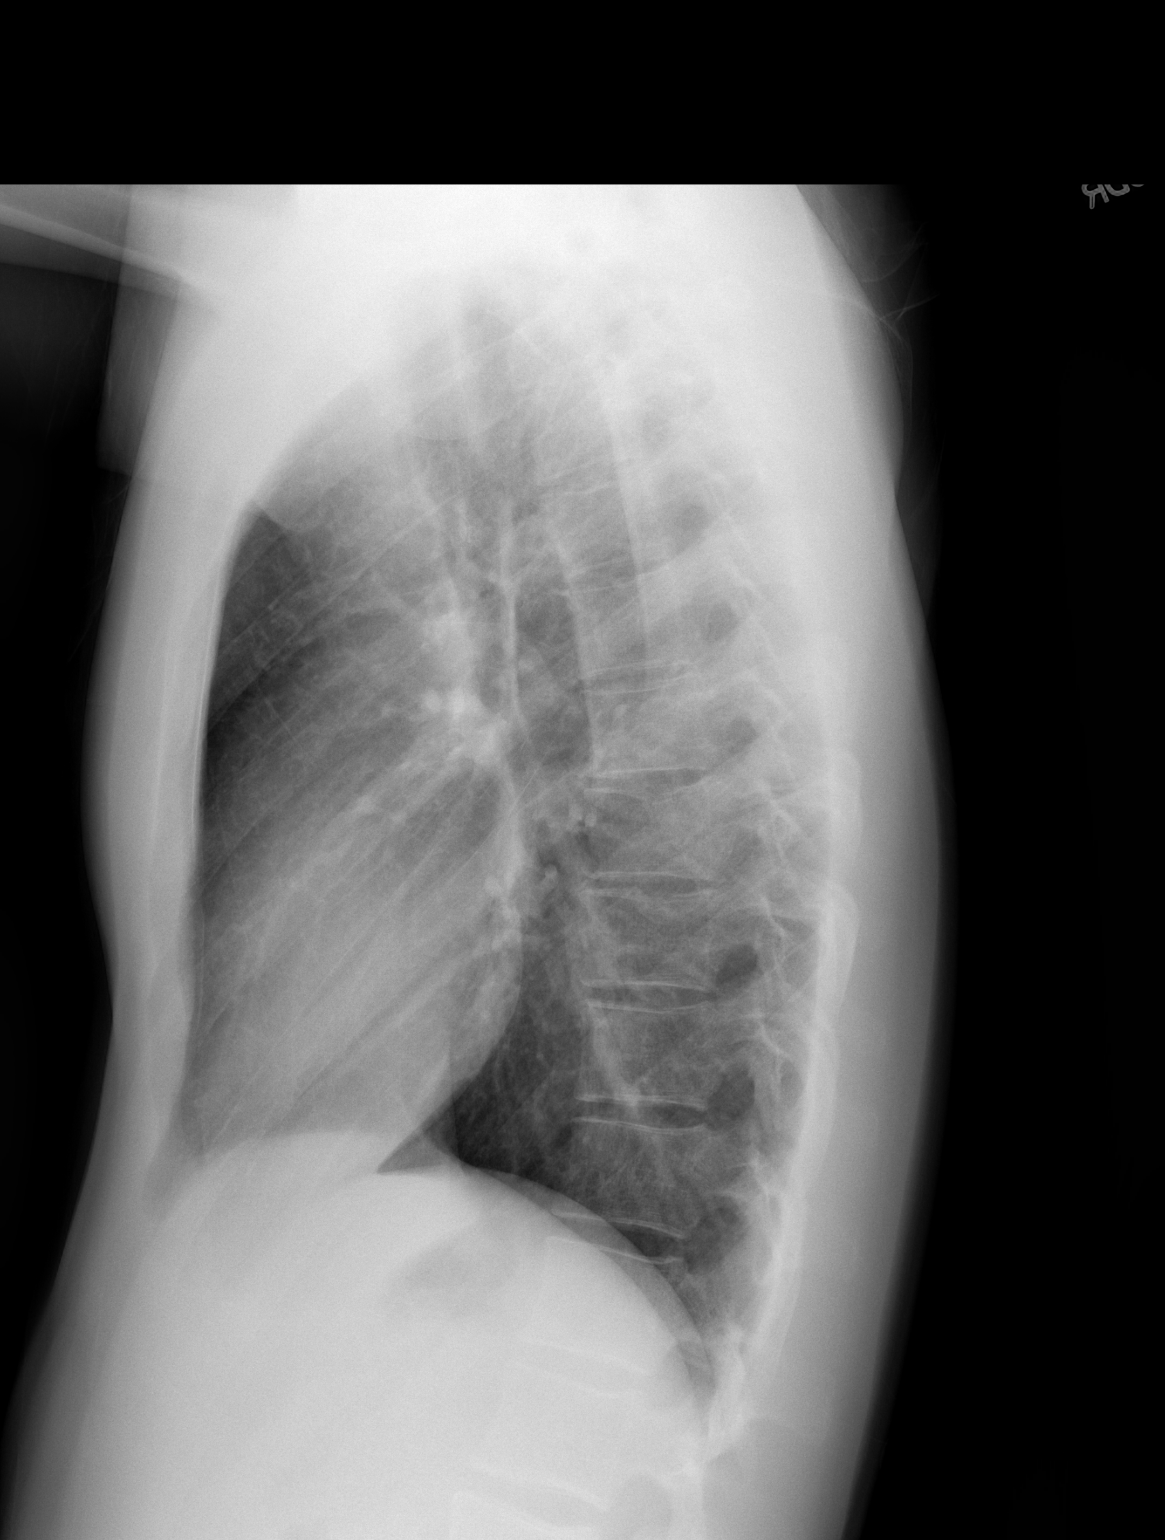

[2 of 2 positions shown; findings below may reference images not displayed]

FINDINGS: Lungs are clear.  No pleural effusion or pneumothorax.

Heart is normal in size.

Visualized osseous structures are within normal limits.
IMPRESSION: Normal chest radiographs.

## 2020-05-30 ENCOUNTER — Ambulatory Visit: Payer: Self-pay | Admitting: Family Medicine

## 2020-07-31 ENCOUNTER — Encounter: Payer: Self-pay | Admitting: Radiology

## 2020-08-02 ENCOUNTER — Other Ambulatory Visit: Payer: Self-pay | Admitting: Family Medicine

## 2020-08-02 DIAGNOSIS — R868 Other abnormal findings in specimens from male genital organs: Secondary | ICD-10-CM

## 2020-08-02 NOTE — Progress Notes (Signed)
Semen analysis shows low sperm count--asked by [facility] to send referral to urology who treats at their facility.

## 2022-05-25 ENCOUNTER — Encounter (HOSPITAL_COMMUNITY): Payer: Self-pay

## 2022-05-25 ENCOUNTER — Other Ambulatory Visit: Payer: Self-pay

## 2022-05-25 ENCOUNTER — Emergency Department (HOSPITAL_COMMUNITY)
Admission: EM | Admit: 2022-05-25 | Discharge: 2022-05-25 | Disposition: A | Discharge status: Home / Self Care | Payer: 59 | Attending: Emergency Medicine | Admitting: Emergency Medicine

## 2022-05-25 DIAGNOSIS — K0889 Other specified disorders of teeth and supporting structures: Secondary | ICD-10-CM | POA: Diagnosis not present

## 2022-05-25 DIAGNOSIS — M25579 Pain in unspecified ankle and joints of unspecified foot: Secondary | ICD-10-CM | POA: Diagnosis not present

## 2022-05-25 DIAGNOSIS — F17210 Nicotine dependence, cigarettes, uncomplicated: Secondary | ICD-10-CM | POA: Diagnosis not present

## 2022-05-25 MED ORDER — NAPROXEN 500 MG PO TABS: 500.0000 mg | ORAL_TABLET | Freq: Two times a day (BID) | ORAL | 0 refills | Status: DC

## 2022-05-25 MED ORDER — OXYCODONE HCL 5 MG PO TABS
5.0000 mg | ORAL_TABLET | Freq: Once | ORAL | Status: AC
  Administered 2022-05-25: 5 mg via ORAL
  Filled 2022-05-25: qty 1

## 2022-05-25 MED ORDER — AMOXICILLIN 500 MG PO CAPS: 500.0000 mg | ORAL_CAPSULE | Freq: Three times a day (TID) | ORAL | 0 refills | Status: DC

## 2022-05-25 MED ORDER — OXYCODONE HCL 5 MG PO TABS: 5.0000 mg | ORAL_TABLET | ORAL | 0 refills | Status: DC | PRN

## 2022-05-25 MED ORDER — AMOXICILLIN 500 MG PO CAPS
500.0000 mg | ORAL_CAPSULE | Freq: Once | ORAL | Status: AC
  Administered 2022-05-25: 500 mg via ORAL
  Filled 2022-05-25: qty 1

## 2022-05-25 MED ORDER — AMOXICILLIN 500 MG PO CAPS: 500.0000 mg | ORAL_CAPSULE | Freq: Three times a day (TID) | ORAL | 0 refills | Status: AC

## 2022-05-25 NOTE — ED Provider Notes (Signed)
[facility] Emergency Department Provider Note MRN:  [id number]  Arrival date & time: 05/25/22     Chief Complaint   Dental Pain   History of Present Illness   Brent Shields is a 26 y.o. year-old male with no pertinent past medical history presenting to the ED with chief complaint of ankle pain.  Pain surrounding teeth 28 and 29.  Pain is severe, present for the past several days and getting worse.  No fever.  Feels like may be an early abscess.  Long history of tooth issues.  Review of Systems  A thorough review of systems was obtained and all systems are negative except as noted in the HPI and PMH.   Patient's Health History    Past Medical History:  Diagnosis Date   ADD (attention deficit disorder)    IBS (irritable bowel syndrome)     History reviewed. No pertinent surgical history.  No family history on file.  Social History   Socioeconomic History   Marital status: Single    Spouse name: Not on file   Number of children: Not on file   Years of education: Not on file   Highest education level: Not on file  Occupational History   Not on file  Tobacco Use   Smoking status: Every Day    Packs/day: 0.50    Types: Cigarettes   Smokeless tobacco: Never  Vaping Use   Vaping Use: Never used  Substance and Sexual Activity   Alcohol use: Yes    Comment: every once in a while   Drug use: Never   Sexual activity: Not on file  Other Topics Concern   Not on file  Social History Narrative   Single.   Highest level of education completed: 11th grade   Works at [employer].    Enjoys writing music, exercising, walking.   Social Determinants of Health   Financial Resource Strain: Not on file  Food Insecurity: Not on file  Transportation Needs: Not on file  Physical Activity: Not on file  Stress: Not on file  Social Connections: Not on file  Intimate Partner Violence: Not on file     Physical Exam   Vitals:   05/25/22 0357  BP: (!)  143/89  Pulse: 80  Resp: 16  Temp: 99.2 F (37.3 C)  SpO2: 98%    CONSTITUTIONAL: Well-appearing, NAD NEURO/PSYCH:  Alert and oriented x 3, no focal deficits EYES:  eyes equal and reactive ENT/NECK:  no LAD, no JVD CARDIO: Regular rate, well-perfused, normal S1 and S2 PULM:  CTAB no wheezing or rhonchi GI/GU:  non-distended, non-tender MSK/SPINE:  No gross deformities, no edema SKIN:  no rash, atraumatic   *Additional and/or pertinent findings included in MDM below  Diagnostic and Interventional Summary    EKG Interpretation  Date/Time:    Ventricular Rate:    PR Interval:    QRS Duration:   QT Interval:    QTC Calculation:   R Axis:     Text Interpretation:         Labs Reviewed - No data to display  No orders to display    Medications  oxyCODONE (Oxy IR/ROXICODONE) immediate release tablet 5 mg (has no administration in time range)  amoxicillin (AMOXIL) capsule 500 mg (has no administration in time range)     Procedures  /  Critical Care Procedures  ED Course and Medical Decision Making  Initial Impression and Ddx Patient has extensive dental decay to all of  his mandibular teeth.  His maxillary teeth have been excised.  There is tenderness to what remains of his teeth numbers 28 and 29, there is no obvious contiguous gingival abscess, he has no fullness to the submandibular region, doubt a more significant contiguous infection.  Needs to be on antibiotics and have his teeth extracted.  Advised dental follow-up.  Providing short course of oxycodone.  Past medical/surgical history that increases complexity of ED encounter:   Interpretation of Diagnostics Laboratory and/or imaging options to aid in the diagnosis/care of the patient were considered.  After careful history and physical examination, it was determined that there was no indication for diagnostics at this time.  Patient Reassessment and Ultimate Disposition/Management     Discharge  Patient  management required discussion with the following services or consulting groups:  None  Complexity of Problems Addressed Acute complicated illness or Injury  Additional Data Reviewed and Analyzed Further history obtained from: Further history from spouse/family member  Additional Factors Impacting ED Encounter Risk Prescriptions  Elmer Sow. Pilar Plate, MD [facility] Emergency Medicine [facility] [email or web address]  Final Clinical Impressions(s) / ED Diagnoses     ICD-10-CM   1. Pain, dental  K08.89       ED Discharge Orders          Ordered    naproxen (NAPROSYN) 500 MG tablet  2 times daily        05/25/22 0601    oxyCODONE (ROXICODONE) 5 MG immediate release tablet  Every 4 hours PRN        05/25/22 0601    amoxicillin (AMOXIL) 500 MG capsule  3 times daily        05/25/22 0601             Discharge Instructions Discussed with and Provided to Patient:    Discharge Instructions      You were evaluated in the Emergency Department and after careful evaluation, we did not find any emergent condition requiring admission or further testing in the hospital.  Symptoms seem to be due to a tooth infection.  Take the amoxicillin antibiotic as directed.  Recommend Tylenol 1000 mg every 4-6 hours.  Use the Naprosyn twice daily for pain as well.  Can use the oxycodone for more significant pain.  Please return to the Emergency Department if you experience any worsening of your condition.  Thank you for allowing [place] to be a part of your care.       Sabas Sous, MD 05/25/22 [phone]

## 2022-05-25 NOTE — Discharge Instructions (Addendum)
You were evaluated in the Emergency Department and after careful evaluation, we did not find any emergent condition requiring admission or further testing in the hospital.  Symptoms seem to be due to a tooth infection.  Take the amoxicillin antibiotic as directed.  Recommend Tylenol 1000 mg every 4-6 hours.  Use the Naprosyn twice daily for pain as well.  Can use the oxycodone for more significant pain.  Please return to the Emergency Department if you experience any worsening of your condition.  Thank you for allowing [place] to be a part of your care.

## 2022-05-25 NOTE — ED Triage Notes (Signed)
Patient arrives POV c/o tooth pain on right lower jaw. Pt reports pain started last night, and swelling started today with pain increasing throughout the day. Pt reports taking tylenol, motrin, and aspirin with no pain relief. Pt states he does not go to the dentist on a regular basis.

## 2022-05-25 NOTE — ED Notes (Addendum)
Patient called in and states that the pharmacy that was originally selected would not be open for the rest of the weekend. Patient requested that medication be resent to [facility] located on [address], [place]. Sent by MD

## 2022-07-31 HISTORY — PX: OTHER SURGICAL HISTORY: SHX169

## 2022-08-07 ENCOUNTER — Ambulatory Visit: Payer: 59 | Admitting: Family Medicine

## 2022-08-07 ENCOUNTER — Ambulatory Visit (INDEPENDENT_AMBULATORY_CARE_PROVIDER_SITE_OTHER): Payer: 59 | Admitting: Family Medicine

## 2022-08-07 ENCOUNTER — Encounter: Payer: Self-pay | Admitting: Family Medicine

## 2022-08-07 VITALS — BP 120/88 | HR 71 | Temp 97.8°F | Ht 66.0 in | Wt 132.0 lb

## 2022-08-07 DIAGNOSIS — H60393 Other infective otitis externa, bilateral: Secondary | ICD-10-CM | POA: Diagnosis not present

## 2022-08-07 DIAGNOSIS — Z0000 Encounter for general adult medical examination without abnormal findings: Secondary | ICD-10-CM | POA: Diagnosis not present

## 2022-08-07 MED ORDER — NEOMYCIN-POLYMYXIN-HC 3.5-10000-1 OT SOLN: 4.0000 [drp] | Freq: Four times a day (QID) | OTIC | 0 refills | Status: AC

## 2022-08-07 NOTE — Progress Notes (Signed)
New Patient Office Visit  Subjective    Patient ID: Brent Shields, male    DOB: 11/15/1995  Age: 26 y.o. MRN: [id number]  CC:  Chief Complaint  Patient presents with   Establish Care    HPI Brent Shields presents to establish care. Oriented to practice routines and expectations. Has not had a PCP. Concerns today include drops in blood sugar, he will experience lightheadedness at random times, sometimes after he eats, and resolves when he consumes orange juice. He checks his blood sugar on his girlfriend's glucometer and has gotten readings as low as 45. He does consume a lot of soda. His past medical history includes IBS, ADHD, depression and anxiety. 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 He recently quit smoking, denies drug or alcohol use.   Outpatient Encounter Medications as of 08/07/2022  Medication Sig   ibuprofen (ADVIL) 800 MG tablet Take 800 mg by mouth every 8 (eight) hours as needed.   neomycin-polymyxin-hydrocortisone (CORTISPORIN) OTIC solution Place 4 drops into both ears 4 (four) times daily for 10 days.   [DISCONTINUED] benzonatate (TESSALON) 100 MG capsule Take 1 capsule (100 mg total) by mouth every 8 (eight) hours. (Patient not taking: Reported on 08/07/2022)   [DISCONTINUED] ibuprofen (ADVIL,MOTRIN) 600 MG tablet Take 1 tablet (600 mg total) by mouth every 6 (six) hours as needed. (Patient not taking: Reported on 08/07/2022)   [DISCONTINUED] naproxen (NAPROSYN) 500 MG tablet Take 1 tablet (500 mg total) by mouth 2 (two) times daily. (Patient not taking: Reported on 08/07/2022)   [DISCONTINUED] oxyCODONE (ROXICODONE) 5 MG immediate release tablet Take 1 tablet (5 mg total) by mouth every 4 (four) hours as needed for severe pain. (Patient not taking: Reported on 08/07/2022)   No facility-administered encounter medications on file as of 08/07/2022.    Past Medical History:  Diagnosis Date   ADD (attention deficit disorder)    ADHD    IBS  (irritable bowel syndrome)     Past Surgical History:  Procedure Laterality Date   oral surgery  07/31/2022    History reviewed. No pertinent family history.  Social History   Socioeconomic History   Marital status: Single    Spouse name: Not on file   Number of children: Not on file   Years of education: Not on file   Highest education level: Not on file  Occupational History   Not on file  Tobacco Use   Smoking status: Former    Packs/day: 0.50    Types: Cigarettes   Smokeless tobacco: Never  Vaping Use   Vaping Use: Never used  Substance and Sexual Activity   Alcohol use: Yes    Comment: every once in a while   Drug use: Never   Sexual activity: Not on file  Other Topics Concern   Not on file  Social History Narrative   Single.   Highest level of education completed: 11th grade   Works at [employer].    Enjoys writing music, exercising, walking.   Social Determinants of Health   Financial Resource Strain: Not on file  Food Insecurity: Not on file  Transportation Needs: Not on file  Physical Activity: Not on file  Stress: Not on file  Social Connections: Not on file  Intimate Partner Violence: Not on file    Review of Systems  Constitutional:  Positive for malaise/fatigue.  HENT:  Positive for ear discharge, ear pain and hearing loss.   Eyes: Negative.   Respiratory:  Negative.    Cardiovascular: Negative.   Gastrointestinal: Negative.   Genitourinary: Negative.   Musculoskeletal: Negative.   Skin: Negative.   Neurological:  Positive for dizziness.  Endo/Heme/Allergies: Negative.   Psychiatric/Behavioral:  Positive for depression. The patient is nervous/anxious.         Objective    BP 120/88   Pulse 71   Temp 97.8 F (36.6 C) (Oral)   Ht 5\' 6"  (1.676 m)   Wt 132 lb (59.9 kg)   SpO2 98%   BMI 21.31 kg/m   Physical Exam Vitals and nursing note reviewed.  Constitutional:      Appearance: Normal appearance. He is normal weight.   HENT:     Head: Normocephalic and atraumatic.     Right Ear: Tympanic membrane normal. Drainage, swelling and tenderness present.     Left Ear: Tympanic membrane normal. Drainage, swelling and tenderness present.     Nose: Nose normal.     Mouth/Throat:     Mouth: Mucous membranes are moist.     Pharynx: Oropharynx is clear.  Eyes:     Extraocular Movements: Extraocular movements intact.     Right eye: Normal extraocular motion and no nystagmus.     Left eye: Normal extraocular motion and no nystagmus.     Conjunctiva/sclera: Conjunctivae normal.     Pupils: Pupils are equal, round, and reactive to light.  Cardiovascular:     Rate and Rhythm: Normal rate and regular rhythm.     Pulses: Normal pulses.     Heart sounds: Normal heart sounds.  Pulmonary:     Effort: Pulmonary effort is normal.     Breath sounds: Normal breath sounds.  Abdominal:     General: Bowel sounds are normal.     Palpations: Abdomen is soft.  Musculoskeletal:        General: Normal range of motion.     Cervical back: Normal range of motion and neck supple.  Skin:    General: Skin is warm and dry.     Capillary Refill: Capillary refill takes less than 2 seconds.  Neurological:     General: No focal deficit present.     Mental Status: He is alert and oriented to person, place, and time.  Psychiatric:        Mood and Affect: Mood normal.        Speech: Speech normal.        Behavior: Behavior normal.        Thought Content: Thought content normal.        Cognition and Memory: Cognition and memory normal.        Judgment: Judgment normal.         Assessment & Plan:   Problem List Items Addressed This Visit       Nervous and Auditory   Otitis, externa, infective    Chronic, history of recurrent swimmer's ear, has seen ENT before and reports having had his ears cleaned. Start Cortisporin Otic solution 4 drops 4 times daily for 10 days in both ears. Keep ears dry and clean. Return to office if  symptoms persist or worsen and will try fungal coverage.        Other   Physical exam, annual - Primary    Routine physical with labs completed today. Encouraged well-balanced diet and discussed avoiding large amounts of carbohydrates as this may be resulting in a carb crash causing his hypoglycemic episodes. Health maintenance items up to date.  Relevant Orders   CBC with Differential/Platelet   COMPLETE METABOLIC PANEL WITH GFR   Lipid panel    Return in about 1 year (around 08/08/2023) for annual physical.   Rubie Maid, FNP

## 2022-08-07 NOTE — Assessment & Plan Note (Signed)
Chronic, history of recurrent swimmer's ear, has seen ENT before and reports having had his ears cleaned. Start Cortisporin Otic solution 4 drops 4 times daily for 10 days in both ears. Keep ears dry and clean. Return to office if symptoms persist or worsen and will try fungal coverage.

## 2022-08-07 NOTE — Assessment & Plan Note (Signed)
Routine physical with labs completed today. Encouraged well-balanced diet and discussed avoiding large amounts of carbohydrates as this may be resulting in a carb crash causing his hypoglycemic episodes. Health maintenance items up to date.

## 2022-08-08 LAB — COMPLETE METABOLIC PANEL WITHOUT GFR
AG Ratio: 1.9 (calc) (ref 1.0–2.5)
ALT: 26 U/L (ref 9–46)
AST: 30 U/L (ref 10–40)
Albumin: 4.8 g/dL (ref 3.6–5.1)
Alkaline phosphatase (APISO): 84 U/L (ref 36–130)
BUN: 11 mg/dL (ref 7–25)
CO2: 27 mmol/L (ref 20–32)
Calcium: 9.9 mg/dL (ref 8.6–10.3)
Chloride: 104 mmol/L (ref 98–110)
Creat: 1.05 mg/dL (ref 0.60–1.24)
Globulin: 2.5 g/dL (ref 1.9–3.7)
Glucose, Bld: 96 mg/dL (ref 65–99)
Potassium: 4.4 mmol/L (ref 3.5–5.3)
Sodium: 142 mmol/L (ref 135–146)
Total Bilirubin: 0.4 mg/dL (ref 0.2–1.2)
Total Protein: 7.3 g/dL (ref 6.1–8.1)
eGFR: 99 mL/min/1.73m2

## 2022-08-08 LAB — CBC WITH DIFFERENTIAL/PLATELET
Absolute Monocytes: 872 cells/uL (ref 200–950)
Basophils Absolute: 84 cells/uL (ref 0–200)
Basophils Relative: 0.8 %
Eosinophils Absolute: 315 cells/uL (ref 15–500)
Eosinophils Relative: 3 %
HCT: 44.1 % (ref 38.5–50.0)
Hemoglobin: 15.2 g/dL (ref 13.2–17.1)
Lymphs Abs: 3591 cells/uL (ref 850–3900)
MCH: 31.7 pg (ref 27.0–33.0)
MCHC: 34.5 g/dL (ref 32.0–36.0)
MCV: 91.9 fL (ref 80.0–100.0)
MPV: 10.7 fL (ref 7.5–12.5)
Monocytes Relative: 8.3 %
Neutro Abs: 5639 cells/uL (ref 1500–7800)
Neutrophils Relative %: 53.7 %
Platelets: 295 10*3/uL (ref 140–400)
RBC: 4.8 10*6/uL (ref 4.20–5.80)
RDW: 12 % (ref 11.0–15.0)
Total Lymphocyte: 34.2 %
WBC: 10.5 10*3/uL (ref 3.8–10.8)

## 2022-08-08 LAB — LIPID PANEL
Cholesterol: 200 mg/dL — ABNORMAL HIGH (ref <200)
HDL: 49 mg/dL (ref >=40)
LDL Cholesterol (Calc): 130 mg/dL (calc) — ABNORMAL HIGH
Non-HDL Cholesterol (Calc): 151 mg/dL (calc) — ABNORMAL HIGH (ref <130)
Total CHOL/HDL Ratio: 4.1 (calc) (ref <5.0)
Triglycerides: 103 mg/dL (ref <150)

## 2022-11-11 ENCOUNTER — Ambulatory Visit: Payer: Self-pay | Admitting: Family Medicine

## 2023-03-16 ENCOUNTER — Ambulatory Visit: Payer: Self-pay | Admitting: Family Medicine

## 2023-08-10 ENCOUNTER — Encounter: Payer: 59 | Admitting: Family Medicine

## 2024-02-24 ENCOUNTER — Encounter (HOSPITAL_BASED_OUTPATIENT_CLINIC_OR_DEPARTMENT_OTHER): Payer: Self-pay | Admitting: Emergency Medicine

## 2024-02-24 ENCOUNTER — Emergency Department (HOSPITAL_BASED_OUTPATIENT_CLINIC_OR_DEPARTMENT_OTHER): Payer: Worker's Compensation

## 2024-02-24 ENCOUNTER — Emergency Department (HOSPITAL_BASED_OUTPATIENT_CLINIC_OR_DEPARTMENT_OTHER)
Admission: EM | Admit: 2024-02-24 | Discharge: 2024-02-24 | Disposition: A | Discharge status: Home / Self Care | Payer: Worker's Compensation | Attending: Emergency Medicine | Admitting: Emergency Medicine

## 2024-02-24 ENCOUNTER — Other Ambulatory Visit: Payer: Self-pay

## 2024-02-24 DIAGNOSIS — W312XXA Contact with powered woodworking and forming machines, initial encounter: Secondary | ICD-10-CM | POA: Diagnosis not present

## 2024-02-24 DIAGNOSIS — Y990 Civilian activity done for income or pay: Secondary | ICD-10-CM | POA: Insufficient documentation

## 2024-02-24 DIAGNOSIS — S61215A Laceration without foreign body of left ring finger without damage to nail, initial encounter: Secondary | ICD-10-CM | POA: Insufficient documentation

## 2024-02-24 DIAGNOSIS — Z23 Encounter for immunization: Secondary | ICD-10-CM | POA: Diagnosis not present

## 2024-02-24 MED ORDER — TETANUS-DIPHTH-ACELL PERTUSSIS 5-2.5-18.5 LF-MCG/0.5 IM SUSY
0.5000 mL | PREFILLED_SYRINGE | Freq: Once | INTRAMUSCULAR | Status: AC
  Administered 2024-02-24: 0.5 mL via INTRAMUSCULAR
  Filled 2024-02-24: qty 0.5

## 2024-02-24 MED ORDER — LIDOCAINE HCL (PF) 1 % IJ SOLN
5.0000 mL | Freq: Once | INTRAMUSCULAR | Status: AC
  Administered 2024-02-24: 5 mL
  Filled 2024-02-24: qty 5

## 2024-02-24 NOTE — ED Provider Notes (Signed)
 [place] [facility] Provider Note   CSN: [id number] Arrival date & time: 02/24/24  1705     Patient presents with: Laceration   Brent Shields is a 28 y.o. male.  With noncontributory past medical history presents emergency room with finger laceration.  This is generally left ring finger.  Was using a saw at work and had just replaced the plate.  It slipped and cut his finger. No UTD tdap. No associated numbness or tingling. No blood thinner.     Laceration      Prior to Admission medications   Medication Sig Start Date End Date Taking? Authorizing Provider  ibuprofen  (ADVIL ) 800 MG tablet Take 800 mg by mouth every 8 (eight) hours as needed.    [provider]    Allergies: Patient has no known allergies.    Review of Systems  Skin:  Positive for wound.    Updated Vital Signs BP 129/88 (BP Location: Right Arm)   Pulse 69   Temp 97.6 F (36.4 C)   Resp 16   Ht 5' 6 (1.676 m)   Wt 72.6 kg   SpO2 100%   BMI 25.82 kg/m   Physical Exam Vitals and nursing note reviewed.  Constitutional:      General: He is not in acute distress.    Appearance: He is not toxic-appearing.  HENT:     Head: Normocephalic and atraumatic.   Eyes:     General: No scleral icterus.    Conjunctiva/sclera: Conjunctivae normal.    Cardiovascular:     Rate and Rhythm: Normal rate and regular rhythm.     Pulses: Normal pulses.     Heart sounds: Normal heart sounds.  Pulmonary:     Effort: Pulmonary effort is normal. No respiratory distress.     Breath sounds: Normal breath sounds.  Abdominal:     General: Abdomen is flat. Bowel sounds are normal.     Palpations: Abdomen is soft.     Tenderness: There is no abdominal tenderness.   Musculoskeletal:     Right lower leg: No edema.     Left lower leg: No edema.   Skin:    General: Skin is warm and dry.     Findings: No lesion.     Comments: Left ring finger, with avulsion of skin on  palmar aspect.  Neurovascularly intact distally.  Normal range of motion of joint.   Neurological:     General: No focal deficit present.     Mental Status: He is alert and oriented to person, place, and time. Mental status is at baseline.     (all labs ordered are listed, but only abnormal results are displayed) Labs Reviewed - No data to display  EKG: None  Radiology: DG Finger Ring Left Result Date: 02/24/2024 CLINICAL DATA:  Laceration. EXAM: LEFT RING FINGER 3V COMPARISON:  None Available. FINDINGS: Overlapping bandage obscures the underlying tissues and bony structures. No obvious fracture or dislocation. Preserved joint spaces and bone mineralization. No definite radiopaque foreign body. Please correlate for the exact location of laceration. IMPRESSION: No acute osseous abnormality. No definite radiopaque foreign body. Please correlate for the exact location of the laceration. Electronically Signed   By: Ranell Bring M.D.   On: 02/24/2024 17:35     .Laceration Repair  Date/Time: 02/24/2024 6:56 PM  Performed by: Shermon Warren SAILOR, PA-C Authorized by: Shermon Warren SAILOR, PA-C   Consent:    Consent obtained:  Verbal  Consent given by:  Patient   Risks, benefits, and alternatives were discussed: yes     Risks discussed:  Infection, need for additional repair, nerve damage, poor wound healing, poor cosmetic result, pain, retained foreign body, tendon damage and vascular damage   Alternatives discussed:  No treatment Universal protocol:    Procedure explained and questions answered to patient or proxy's satisfaction: yes     Relevant documents present and verified: yes     Test results available: yes     Imaging studies available: yes     Required blood products, implants, devices, and special equipment available: yes     Site/side marked: yes     Immediately prior to procedure, a time out was called: yes     Patient identity confirmed:  Verbally with patient Anesthesia:     Anesthesia method:  Local infiltration   Local anesthetic:  Lidocaine 1% w/o epi Laceration details:    Location:  Finger   Finger location:  L ring finger   Length (cm):  1 Pre-procedure details:    Preparation:  Imaging obtained to evaluate for foreign bodies and patient was prepped and draped in usual sterile fashion Exploration:    Limited defect created (wound extended): yes   Treatment:    Area cleansed with:  Povidone-iodine   Amount of cleaning:  Standard   Irrigation solution:  Sterile saline   Irrigation method:  Pressure wash   Debridement:  None   Undermining:  None Skin repair:    Repair method:  Sutures   Suture size:  5-0   Suture material:  Nylon   Suture technique:  Simple interrupted   Number of sutures:  2 Approximation:    Approximation:  Loose Repair type:    Repair type:  Simple Post-procedure details:    Dressing:  Antibiotic ointment, non-adherent dressing and bulky dressing   Procedure completion:  Tolerated    Medications Ordered in the ED  Tdap (BOOSTRIX) injection 0.5 mL (0.5 mLs Intramuscular Given 02/24/24 1818)  lidocaine (PF) (XYLOCAINE) 1 % injection 5 mL (5 mLs Infiltration Given 02/24/24 1818)                                    Medical Decision Making Amount and/or Complexity of Data Reviewed Radiology: ordered.  Risk Prescription drug management.   [name] 28 y.o. presented today for a 1 cm laceration/skin avulsion to their left ring finger. Working Ddx: FB, fracture, NV compromise, simple laceration.  R/o DDx: These ddx are considered less likely due to history of present illness and physical exam findings.    Patient presented for a 1 cm laceration to their left ring finger. They are neurovascularly intact. Tetanus is UTD, received here. Patient is in no distress. Laceration will be repaired with standard wound care procedures and antibiotic ointment.     Imaging: Left ring finger no acute findings   Problem List  / ED Course / Critical interventions / Medication management  He was seen in emergency room today for a laceration.  No sign of fracture on x-ray.  Neurovascularly intact.  Able to move DIP through full range of motion.  Tetanus shot here.  Skin is avulsed away sides of the laceration were approximated loosely with 2 sutures. This are was cleaned dried and covered.  Wrapped with direct pressure and observed for short time.  Bleeding stopped with direct pressure.  Discussed  wound management.  Discussed follow-up with primary care.  Will need to have 2 sutures on lateral aspect of the wound removed in approximately 7 to 10 days.  Will return for signs of infection.  Patients vitals assessed. Upon arrival patient is hemodynamically stable.  I have reviewed the patients home medicines and have made adjustments as needed   Plan: F/u for suture removal in 10 days.  Patient is stable for discharge at this time. Patient/family expressed understanding of return precautions and need for follow-up.  Keep wound clean, covered. Educated on sx of concern regarding complications -- such as infection, poor wound healing, compartment sx.       Final diagnoses:  Laceration of left ring finger without foreign body without damage to nail, initial encounter    ED Discharge Orders     None          Shermon Warren SAILOR, PA-C 02/24/24 1859    Jerrol Agent, MD 02/24/24 2125

## 2024-02-24 NOTE — ED Notes (Signed)
 Employee drug test done with [employer]. Dressing change supply given and instructed.Accompanied by employer.

## 2024-02-24 NOTE — ED Triage Notes (Signed)
 Pt with laceration to LT ring finger from a saw blade on a machine at work

## 2024-02-24 NOTE — Discharge Instructions (Signed)
 2 sutures placed, remove in 7-10 days.   Keep area clean dry and covered.  Wash with gentle soap and water.  Can use Neosporin or bacitracin twice daily.  Use Tylenol  ibuprofen  and ice for pain.  Return for signs of infection.

## 2024-02-24 NOTE — ED Notes (Signed)
 Employer drug screen test collected by present RN, pt cooperative, employer representative present in room during test
